# Patient Record
Sex: Female | Born: 1975
Health system: Southern US, Community
[De-identification: ages and names within clinical notes are randomized; demographics above are authoritative.]

## PROBLEM LIST (undated history)

## (undated) HISTORY — PX: DILATION AND CURETTAGE OF UTERUS: SHX78

---

## 1999-09-19 ENCOUNTER — Other Ambulatory Visit: Admission: RE | Admit: 1999-09-19 | Discharge: 1999-09-19 | Payer: Self-pay | Admitting: Obstetrics & Gynecology

## 1999-12-02 ENCOUNTER — Other Ambulatory Visit: Admission: RE | Admit: 1999-12-02 | Discharge: 1999-12-02 | Payer: Self-pay | Admitting: Obstetrics & Gynecology

## 1999-12-17 ENCOUNTER — Ambulatory Visit (HOSPITAL_COMMUNITY): Admission: AD | Admit: 1999-12-17 | Discharge: 1999-12-17 | Payer: Self-pay | Admitting: Obstetrics and Gynecology

## 1999-12-17 ENCOUNTER — Encounter (INDEPENDENT_AMBULATORY_CARE_PROVIDER_SITE_OTHER): Payer: Self-pay | Admitting: Specialist

## 2000-08-21 ENCOUNTER — Ambulatory Visit (HOSPITAL_COMMUNITY): Admission: RE | Admit: 2000-08-21 | Discharge: 2000-08-21 | Payer: Self-pay | Admitting: *Deleted

## 2000-08-21 ENCOUNTER — Encounter (INDEPENDENT_AMBULATORY_CARE_PROVIDER_SITE_OTHER): Payer: Self-pay | Admitting: Specialist

## 2000-12-27 ENCOUNTER — Inpatient Hospital Stay (HOSPITAL_COMMUNITY): Admission: AD | Admit: 2000-12-27 | Discharge: 2000-12-27 | Payer: Self-pay | Admitting: Obstetrics and Gynecology

## 2001-02-02 ENCOUNTER — Ambulatory Visit (HOSPITAL_COMMUNITY): Admission: RE | Admit: 2001-02-02 | Discharge: 2001-02-02 | Payer: Self-pay | Admitting: Obstetrics and Gynecology

## 2001-02-02 ENCOUNTER — Encounter: Payer: Self-pay | Admitting: Obstetrics and Gynecology

## 2001-07-05 ENCOUNTER — Inpatient Hospital Stay (HOSPITAL_COMMUNITY): Admission: AD | Admit: 2001-07-05 | Discharge: 2001-07-07 | Payer: Self-pay | Admitting: Obstetrics and Gynecology

## 2001-08-10 ENCOUNTER — Other Ambulatory Visit: Admission: RE | Admit: 2001-08-10 | Discharge: 2001-08-10 | Payer: Self-pay | Admitting: Obstetrics and Gynecology

## 2004-03-21 ENCOUNTER — Other Ambulatory Visit: Admission: RE | Admit: 2004-03-21 | Discharge: 2004-03-21 | Payer: Self-pay | Admitting: Obstetrics and Gynecology

## 2005-05-02 ENCOUNTER — Inpatient Hospital Stay (HOSPITAL_COMMUNITY): Admission: AD | Admit: 2005-05-02 | Discharge: 2005-05-04 | Payer: Self-pay | Admitting: Obstetrics and Gynecology

## 2005-06-17 ENCOUNTER — Other Ambulatory Visit: Admission: RE | Admit: 2005-06-17 | Discharge: 2005-06-17 | Payer: Self-pay | Admitting: Obstetrics and Gynecology

## 2007-10-21 HISTORY — PX: BREAST ENHANCEMENT SURGERY: SHX7

## 2007-12-03 ENCOUNTER — Emergency Department (HOSPITAL_COMMUNITY): Admission: EM | Admit: 2007-12-03 | Discharge: 2007-12-03 | Payer: Self-pay | Admitting: Emergency Medicine

## 2008-12-28 ENCOUNTER — Emergency Department (HOSPITAL_COMMUNITY): Admission: EM | Admit: 2008-12-28 | Discharge: 2008-12-28 | Payer: Self-pay | Admitting: Family Medicine

## 2011-01-30 LAB — POCT URINALYSIS DIP (DEVICE)
Bilirubin Urine: NEGATIVE
Glucose, UA: NEGATIVE mg/dL
Ketones, ur: NEGATIVE mg/dL
Nitrite: NEGATIVE
Protein, ur: NEGATIVE mg/dL
Specific Gravity, Urine: 1.01 (ref 1.005–1.030)
Urobilinogen, UA: 0.2 mg/dL (ref 0.0–1.0)
pH: 7 (ref 5.0–8.0)

## 2011-01-30 LAB — URINE CULTURE: Colony Count: 10000

## 2011-01-30 LAB — POCT PREGNANCY, URINE: Preg Test, Ur: NEGATIVE

## 2011-03-07 NOTE — Op Note (Signed)
[facility]  PatientDAZIYA, [name]                        MRN: [id number] Proc. Date: 08/21/00 Adm. Date:  [id number] Attending:  Pleas Koch                           Operative Report  PREOPERATIVE DIAGNOSIS:       Missed abortion at 8 weeks.  POSTOPERATIVE DIAGNOSIS:      Missed abortion at 8 weeks.  OPERATION PERFORMED:          Suction ______ and evacuation.  SURGEON:                      Georgina Peer, M.D.  ANESTHESIA:                   Monitored anesthesia care using 1% Xylocaine paracervical block.  ESTIMATED BLOOD LOSS:         Less than 50 cc.  COMPLICATIONS:                None.  INDICATIONS:                  A 36 year old gravida 3, para 0-0-2-0 with eight weeks from her last menstrual period and ultrasound revealed a nonviable 8 week fetus.  She elected D&E.  DESCRIPTION OF PROCEDURE:     Patient was taken to the operating room, given IV sedation, and placed in the dorsal lithotomy position.  Prepped and draped in the normal sterile fashion.  Red rubber catheter emptied her bladder. Bimanual examination revealed a 6 week anterior uterus with no adnexal masses. The patient had paracervical block using 20 cc 1% Xylocaine plain injected and then cervix was progressively dilated to a 25 [place] with Pratt dilators and a 10 curved suction curette was thoroughly evacuate the uterine contents.  There was minimal bleeding.  The uterus was well contracted and normal size at the end of the case.  Sponge, needle, and instrument counts were correct.  Patient tolerated the procedure well and returned to the recovery area in stable condition. DD:  08/21/00 TD:  08/21/00 Job: [id number]

## 2011-03-07 NOTE — Discharge Summary (Signed)
[name], [name]               ACCOUNT NO.:  [id number]   MEDICAL RECORD NO.:  [id number]          PATIENT TYPE:  INP   LOCATION:  9116                          FACILITY:  [facility]   PHYSICIAN:  Huel Cote, M.D. DATE OF BIRTH:  11/23/1975   DATE OF ADMISSION:  05/02/2005  DATE OF DISCHARGE:                                 DISCHARGE SUMMARY   DISCHARGE DIAGNOSES:  1.  Term pregnancy at 39+ weeks, delivered.  2.  Status post normal spontaneous vaginal delivery.   DISCHARGE MEDICATIONS:  Motrin 600 mg p.o. q.6h.   DISCHARGE FOLLOW-UP:  The patient is to follow up in the office in 6 weeks  for her routine postpartum exam.   HOSPITAL COURSE:  The patient is a 35 year old G5 P1-0-3-1 who is admitted  at 39+ weeks gestation for induction of labor given a term status, favorable  cervix, and a history of macrosomia. Prenatal care was uneventful. Prenatal  labs are as follows:  O positive, antibody negative, rubella immune, RPR  nonreactive, hepatitis B negative, HIV negative, GC negative, chlamydia  negative, 1-hour Glucola 114, group B strep negative. Past obstetric  history:  In 2002 she had a vaginal delivery of a 9-pound 8-ounce infant and  she has had two spontaneous miscarriages and one elective abortion. Past GYN  history:  None. Past medical history:  None. Past surgical history:  She had  D&C x2. Allergies:  None. Medications:  None. On admission she was afebrile  with stable vital signs. Fetal heart rate was reactive. Cervix was 50 and 2  and a -2. She had rupture of membranes performed with clear fluid noted and  was placed on IV Pitocin. She did very well and reached complete dilatation  throughout the day, pushed well, and delivered a vigorous female infant over  an OA position. Apgars were 9 and 9, weight was 7 pounds 6 ounces. The  placenta delivered spontaneously. There were no lacerations on the cervix,  rectum, or vagina, and estimated blood loss was about 350 mL. The  patient  did very well postpartum and on postpartum day #2 was ready for discharge  home. Her discharge hemoglobin was 11 and she was afebrile with stable vital  signs and had no problems.       KR/MEDQ  D:  05/04/2005  T:  05/04/2005  Job:  [id number]

## 2011-03-07 NOTE — Discharge Summary (Signed)
[facility]  PatientKARRY, [name] Visit Number: [id number] MRN: [id number]          Service Type: OBS Location: [facility] Attending Physician:  Oliver Pila Dictated by:   Alvino Chapel, M.D.                             Discharge Summary  DISCHARGE DIAGNOSES:          1. Term pregnancy at 40+ weeks, delivered.                               2. Status post normal spontaneous vaginal                                  delivery.  DISCHARGE MEDICATIONS:        1. Motrin 600 mg p.o. every six hours p.r.n.                               2. Percocet one to two tablets p.o. every four                                  hours p.r.n.  DISCHARGE FOLLOWUP:           The patient is to follow up in approximately six weeks for her routine postpartum exam.  HOSPITAL COURSE:              The patient is a 35 year old, G4, P0-0-3-0, who was admitted at 40-3/7 weeks for induction given postdates and favorable cervix. Pregnancy was uncomplicated.  PRENATAL LABORATORY DATA:     O positive, antibody negative, rubella immune, hepatitis B surface antigen negative, HIV negative, GC negative, Chlamydia negative, AFP negative, GBS negative.  PAST OBSTETRIC HISTORY:       In 1994 she had an elective abortion. 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  PAST GYNECOLOGIC HISTORY:     D&C x 2. History of irregular periods.  PAST MEDICAL HISTORY:         None.  PAST SURGICAL HISTORY:        D&C x 2.  ALLERGIES:                    None.  MEDICATIONS:                  None.  HOSPITAL COURSE:              The patient was afebrile with stable vital signs. Fetal heart rate was reactive on admission. Cervix was 70% effaced, 2+ cm dilated, and a -2 station. EFW was 8 to 8-1/2 pounds. The patient had assisted ruptured membranes with clear fluid obtained. She then was placed on Pitocin and progressed well throughout the day. She reached complete  dilation and pushed for approximately 15 minutes with a normal spontaneous vaginal delivery of a viable female infant over an intact perineum. Apgars were 8 and 9. Weight was 9 pounds 8 ounces. There was a tight nuchal cord which was delivered through. A mild shoulder dystocia was relived with McRoberts and suprapubic pressure. There were bilaterally periurethral lacerations which were repaired with  3-0 Vicryl in interrupted sutures for hemostasis. Cervix, rectum, and vagina were all intact. The patient was then admitted for routine postpartum care and did well.  On postpartum day #2 she was afebrile with stable vital signs and was felt stable for discharge. She was going to stay in house with the baby for one additional day who is undergoing phototherapy for jaundice. Dictated by:   Alvino Chapel, M.D. Attending Physician:  Oliver Pila DD:  07/07/01 TD:  07/07/01 Job: [id number]

## 2011-03-07 NOTE — Op Note (Signed)
[facility]  PatientGENIVA, [name]                        MRN: [id number] Proc. Date: 12/17/99 Adm. Date:  [id number] Attending:  Cleatrice Burke                           Operative Report  PREOPERATIVE DIAGNOSIS:       Blighted ovum.  POSTOPERATIVE DIAGNOSIS:      Blighted ovum.  OPERATION:                    Dilatation and evacuation.  SURGEON:                      Cecilio Asper, M.D.  ASSISTANT:  ANESTHESIA:                   MAC and local.  ESTIMATED BLOOD LOSS:         Minimal.  COMPLICATIONS:                None.  INDICATIONS:                  The patient is a 35 year old, gravida 2, para 0, ho was diagnosed with a blighted ovum by ultrasound.  The patient therefore consented for definitive therapy in the form of dilatation and evacuation.  DESCRIPTION OF PROCEDURE:     After adequate level of MAC anesthesia was obtained, the patient was prepped and draped in a sterile fashion.  A speculum was placed  inside of the vagina.  10 cc of 1% lidocaine was injected into the cervix to achieve a paracervical block.  A single tooth tenaculum was placed on the anterior lip of the cervix.  The uterus sounded to approximately 10 cm.  The cervix was dilated to a #29 [place] using the [employer].  A #8 straight suction curet as placed inside of the uterus and a suction curettage was performed with a moderate amount of tissue being obtained.  Using a medium sharp curet, a sharp curettage was performed until a gritty feeling was noted on all surfaces.  The uterus firmed p nicely with Pitocin.  Bleeding was minimal.  Vaginal instruments were removed. The patient tolerated the procedure well.  She was taken to the recovery room in stable condition. DD:  12/17/99 TD:  12/17/99 Job: [id number]

## 2011-10-24 ENCOUNTER — Encounter (HOSPITAL_COMMUNITY): Payer: Self-pay | Admitting: Anesthesiology

## 2011-10-24 ENCOUNTER — Emergency Department (HOSPITAL_COMMUNITY): Payer: No Typology Code available for payment source

## 2011-10-24 ENCOUNTER — Encounter (HOSPITAL_COMMUNITY)
Admission: EM | Disposition: A | Discharge status: Home / Self Care | Payer: Self-pay | Source: Home / Self Care | Attending: Emergency Medicine

## 2011-10-24 ENCOUNTER — Emergency Department (HOSPITAL_COMMUNITY): Payer: No Typology Code available for payment source | Admitting: Anesthesiology

## 2011-10-24 ENCOUNTER — Emergency Department (HOSPITAL_COMMUNITY)
Admission: EM | Admit: 2011-10-24 | Discharge: 2011-10-25 | Disposition: A | Discharge status: Home / Self Care | Payer: No Typology Code available for payment source | Attending: Emergency Medicine | Admitting: Emergency Medicine

## 2011-10-24 DIAGNOSIS — S62109A Fracture of unspecified carpal bone, unspecified wrist, initial encounter for closed fracture: Secondary | ICD-10-CM

## 2011-10-24 DIAGNOSIS — S0003XA Contusion of scalp, initial encounter: Secondary | ICD-10-CM | POA: Insufficient documentation

## 2011-10-24 DIAGNOSIS — S02609A Fracture of mandible, unspecified, initial encounter for closed fracture: Secondary | ICD-10-CM

## 2011-10-24 DIAGNOSIS — S02620A Fracture of subcondylar process of mandible, unspecified side, initial encounter for closed fracture: Secondary | ICD-10-CM | POA: Insufficient documentation

## 2011-10-24 DIAGNOSIS — S81009A Unspecified open wound, unspecified knee, initial encounter: Secondary | ICD-10-CM | POA: Insufficient documentation

## 2011-10-24 DIAGNOSIS — S1093XA Contusion of unspecified part of neck, initial encounter: Secondary | ICD-10-CM | POA: Insufficient documentation

## 2011-10-24 DIAGNOSIS — S0180XA Unspecified open wound of other part of head, initial encounter: Secondary | ICD-10-CM | POA: Insufficient documentation

## 2011-10-24 DIAGNOSIS — S0181XA Laceration without foreign body of other part of head, initial encounter: Secondary | ICD-10-CM

## 2011-10-24 DIAGNOSIS — S52599A Other fractures of lower end of unspecified radius, initial encounter for closed fracture: Secondary | ICD-10-CM | POA: Insufficient documentation

## 2011-10-24 HISTORY — PX: CLOSED REDUCTION MANDIBLE WITH MANDIBULOMA: SHX5313

## 2011-10-24 SURGERY — CLOSED REDUCTION, MANDIBLE, WITH ARCH BAR APPLICATION AND INTERMAXILLARY FIXATION
Anesthesia: General | Site: Mouth | Laterality: Right | Wound class: Clean Contaminated

## 2011-10-24 MED ORDER — MORPHINE SULFATE 4 MG/ML IJ SOLN
4.0000 mg | Freq: Once | INTRAMUSCULAR | Status: AC
  Administered 2011-10-24: 4 mg via INTRAVENOUS
  Filled 2011-10-24: qty 1

## 2011-10-24 MED ORDER — CEFAZOLIN SODIUM 1-5 GM-% IV SOLN
INTRAVENOUS | Status: AC
  Filled 2011-10-24: qty 50

## 2011-10-24 MED ORDER — HYDROMORPHONE HCL PF 1 MG/ML IJ SOLN
1.0000 mg | Freq: Once | INTRAMUSCULAR | Status: AC
  Administered 2011-10-24: 1 mg via INTRAVENOUS
  Filled 2011-10-24: qty 1

## 2011-10-24 MED ORDER — MORPHINE SULFATE 2 MG/ML IJ SOLN
2.0000 mg | Freq: Once | INTRAMUSCULAR | Status: AC
  Administered 2011-10-24: 2 mg via INTRAVENOUS
  Filled 2011-10-24: qty 1

## 2011-10-24 MED ORDER — SODIUM CHLORIDE 0.9 % IV BOLUS (SEPSIS)
1000.0000 mL | Freq: Once | INTRAVENOUS | Status: AC
  Administered 2011-10-24: 1000 mL via INTRAVENOUS

## 2011-10-24 SURGICAL SUPPLY — 38 items
BLADE SURG 15 STRL LF DISP TIS (BLADE) ×2 IMPLANT
BLADE SURG 15 STRL SS (BLADE) ×3
BLADE SURG ROTATE 9660 (MISCELLANEOUS) IMPLANT
CANISTER SUCTION 2500CC (MISCELLANEOUS) ×3 IMPLANT
CLEANER TIP ELECTROSURG 2X2 (MISCELLANEOUS) ×3 IMPLANT
CLOTH BEACON ORANGE TIMEOUT ST (SAFETY) ×3 IMPLANT
COVER SURGICAL LIGHT HANDLE (MISCELLANEOUS) ×3 IMPLANT
DECANTER SPIKE VIAL GLASS SM (MISCELLANEOUS) ×3 IMPLANT
DRSG NASOPORE 8CM (GAUZE/BANDAGES/DRESSINGS) IMPLANT
ELECT COATED BLADE 2.86 ST (ELECTRODE) ×3 IMPLANT
ELECT REM PT RETURN 9FT ADLT (ELECTROSURGICAL) ×3
ELECTRODE REM PT RTRN 9FT ADLT (ELECTROSURGICAL) ×2 IMPLANT
GLOVE SURG SS PI 7.5 STRL IVOR (GLOVE) ×3 IMPLANT
GOWN STRL NON-REIN LRG LVL3 (GOWN DISPOSABLE) ×6 IMPLANT
KIT BASIN OR (CUSTOM PROCEDURE TRAY) ×3 IMPLANT
KIT ROOM TURNOVER OR (KITS) ×3 IMPLANT
NEEDLE 27GAX1X1/2 (NEEDLE) ×3 IMPLANT
NS IRRIG 1000ML POUR BTL (IV SOLUTION) ×3 IMPLANT
PAD ARMBOARD 7.5X6 YLW CONV (MISCELLANEOUS) ×3 IMPLANT
PENCIL FOOT CONTROL (ELECTRODE) ×3 IMPLANT
SCISSORS WIRE ANG 4 3/4 DISP (INSTRUMENTS) ×3 IMPLANT
SCREW UPPER FACE 2.0X12MM (Screw) ×3 IMPLANT
SCREW UPPER FACE 2.0X8MM (Screw) ×6 IMPLANT
SUT ETHILON 4 0 CL P 3 (SUTURE) ×3 IMPLANT
SUT MON AB 3-0 SH 27 (SUTURE)
SUT MON AB 3-0 SH27 (SUTURE) IMPLANT
SUT PROLENE 6 0 PC 1 (SUTURE) IMPLANT
SUT STEEL 0 (SUTURE)
SUT STEEL 0 18XMFL TIE 17 (SUTURE) IMPLANT
SUT STEEL 1 (SUTURE) IMPLANT
SUT STEEL 2 (SUTURE) ×3 IMPLANT
SUT STEEL 4 (SUTURE) IMPLANT
SUT VICRYL 4-0 PS2 18IN ABS (SUTURE) ×3 IMPLANT
TOWEL OR 17X24 6PK STRL BLUE (TOWEL DISPOSABLE) ×3 IMPLANT
TOWEL OR 17X26 10 PK STRL BLUE (TOWEL DISPOSABLE) ×3 IMPLANT
TRAY ENT MC OR (CUSTOM PROCEDURE TRAY) ×3 IMPLANT
TRAY FOLEY CATH 14FRSI W/METER (CATHETERS) IMPLANT
WATER STERILE IRR 1000ML POUR (IV SOLUTION) IMPLANT

## 2011-10-24 NOTE — ED Notes (Signed)
Pt transported to OR stable and in no acute distress.

## 2011-10-24 NOTE — ED Notes (Signed)
Patient transported to CT 

## 2011-10-24 NOTE — ED Notes (Signed)
Family at bedside. 

## 2011-10-24 NOTE — ED Notes (Signed)
Pt to go to OR for mandible fixation.  MD aware pt still needs laceration sutured and arm splinted.  Ortho paged.

## 2011-10-24 NOTE — ED Notes (Signed)
Pt feeling much better after Dilaudid.  Noted resp equal and unlabored and rates pain 4/10. Family at bedside.

## 2011-10-24 NOTE — Anesthesia Preprocedure Evaluation (Addendum)
Anesthesia Evaluation  Patient identified by MRN, date of birth, ID band Patient awake    Reviewed: Allergy & Precautions, H&P , NPO status , Patient's Chart, lab work & pertinent test results  Airway Mallampati: I TM Distance: >3 FB Neck ROM: full  Mouth opening: Limited Mouth Opening  Dental  (+) Dental Advisory Given   Pulmonary neg pulmonary ROS,    Pulmonary exam normal       Cardiovascular neg cardio ROS regular Normal    Neuro/Psych Negative Neurological ROS  Negative Psych ROS   GI/Hepatic Neg liver ROS,   Endo/Other  Negative Endocrine ROS  Renal/GU negative Renal ROS  Genitourinary negative   Musculoskeletal   Abdominal (+)  Abdomen: soft. Bowel sounds: normal.  Peds  Hematology   Anesthesia Other Findings malocclusion secondary to mandibular fx    Reproductive/Obstetrics                          Anesthesia Physical Anesthesia Plan  ASA: I  Anesthesia Plan: General   Post-op Pain Management:    Induction: Intravenous, Rapid sequence and Cricoid pressure planned  Airway Management Planned: Nasal ETT  Additional Equipment:   Intra-op Plan:   Post-operative Plan: Extubation in OR  Informed Consent: I have reviewed the patients History and Physical, chart, labs and discussed the procedure including the risks, benefits and alternatives for the proposed anesthesia with the patient or authorized representative who has indicated his/her understanding and acceptance.     Plan Discussed with: Anesthesiologist, Surgeon and CRNA  Anesthesia Plan Comments:        Anesthesia Quick Evaluation
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
## 2011-10-24 NOTE — Progress Notes (Signed)
Orthopedic Tech Progress Note Patient Details:  [name] 05/05/1976 [id number]  Type of Splint: Sugartong Splint Location: (R) UE Splint Interventions: Application    Jennye Moccasin 10/24/2011, 11:23 PM
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
## 2011-10-24 NOTE — ED Notes (Signed)
Belongings removed and given to s/o in prep for OR.

## 2011-10-25 MED ORDER — ONDANSETRON HCL 4 MG/2ML IJ SOLN
INTRAMUSCULAR | Status: DC | PRN
  Administered 2011-10-25: 4 mg via INTRAVENOUS

## 2011-10-25 MED ORDER — PROPOFOL 10 MG/ML IV EMUL
INTRAVENOUS | Status: DC | PRN
  Administered 2011-10-24: 170 mg via INTRAVENOUS

## 2011-10-25 MED ORDER — MIDAZOLAM HCL 5 MG/5ML IJ SOLN
INTRAMUSCULAR | Status: DC | PRN
  Administered 2011-10-24: 1 mg via INTRAVENOUS

## 2011-10-25 MED ORDER — DEXAMETHASONE SODIUM PHOSPHATE 4 MG/ML IJ SOLN
INTRAMUSCULAR | Status: DC | PRN
  Administered 2011-10-25: 8 mg via INTRAVENOUS

## 2011-10-25 MED ORDER — LIDOCAINE-EPINEPHRINE 1 %-1:100000 IJ SOLN
INTRAMUSCULAR | Status: DC | PRN
  Administered 2011-10-25: 10 mL

## 2011-10-25 MED ORDER — LACTATED RINGERS IV SOLN: INTRAVENOUS | Status: DC

## 2011-10-25 MED ORDER — ONDANSETRON HCL 4 MG/2ML IJ SOLN: 4.0000 mg | Freq: Once | INTRAMUSCULAR | Status: DC | PRN

## 2011-10-25 MED ORDER — FENTANYL CITRATE 0.05 MG/ML IJ SOLN
INTRAMUSCULAR | Status: DC | PRN
  Administered 2011-10-24: 50 ug via INTRAVENOUS
  Administered 2011-10-24 – 2011-10-25 (×2): 100 ug via INTRAVENOUS

## 2011-10-25 MED ORDER — CEFAZOLIN SODIUM 1-5 GM-% IV SOLN
INTRAVENOUS | Status: DC | PRN
  Administered 2011-10-24: 1 g via INTRAVENOUS

## 2011-10-25 MED ORDER — 0.9 % SODIUM CHLORIDE (POUR BTL) OPTIME
TOPICAL | Status: DC | PRN
  Administered 2011-10-25: 1000 mL

## 2011-10-25 MED ORDER — HYDROMORPHONE HCL PF 1 MG/ML IJ SOLN
0.2500 mg | INTRAMUSCULAR | Status: DC | PRN
  Administered 2011-10-25 (×2): 0.5 mg via INTRAVENOUS

## 2011-10-25 MED ORDER — LACTATED RINGERS IV SOLN
INTRAVENOUS | Status: DC | PRN
  Administered 2011-10-24: via INTRAVENOUS

## 2011-10-25 MED ORDER — SUCCINYLCHOLINE CHLORIDE 20 MG/ML IJ SOLN
INTRAMUSCULAR | Status: DC | PRN
  Administered 2011-10-24: 100 mg via INTRAVENOUS

## 2011-10-25 NOTE — Transfer of Care (Signed)
Immediate Anesthesia Transfer of Care Note  Patient: [name]  Procedure(s) Performed:  CLOSED REDUCTION MANDIBLE WITH MANDIBULOMAXILLARY FUSION  Patient Location: PACU  Anesthesia Type: General  Level of Consciousness: awake, sedated and patient cooperative  Airway & Oxygen Therapy: Patient Spontanous Breathing and Patient connected to nasal cannula oxygen  Post-op Assessment: Report given to PACU RN, Post -op Vital signs reviewed and stable and Patient moving all extremities  Post vital signs: Reviewed and stable  Complications: No apparent anesthesia complications

## 2011-10-25 NOTE — Brief Op Note (Signed)
10/24/2011 - 10/25/2011  1:03 AM  PATIENT:  [name]  36 y.o. female  PRE-OPERATIVE DIAGNOSIS: right subcondylar Mandible Fracture  POST-OPERATIVE DIAGNOSIS: right subcondylar Mandible Fracture  PROCEDURE:  Procedure(s): CLOSED REDUCTION MANDIBLE WITH MAXILLOMANDIBULAR FIXATION 21453  SURGEON:  Surgeon(s): [employer]  PHYSICIAN ASSISTANT: none  ASSISTANTS: none  ANESTHESIA:   general  EBL:  Total I/O In: 675 [I.V.:675] Out: -   BLOOD ADMINISTERED:none  DRAINS: none   LOCAL MEDICATIONS USED:  XYLOCAINE 10CC  SPECIMEN:  No Specimen  DISPOSITION OF SPECIMEN:  N/A  COUNTS:  YES  TOURNIQUET:  * No tourniquets in log *  DICTATION: .Note written in EPIC  PLAN OF CARE: Discharge to home after PACU  PATIENT DISPOSITION:  PACU - hemodynamically stable.   Delay start of Pharmacological VTE agent (>24hrs) due to surgical blood loss or risk of bleeding: no

## 2011-10-25 NOTE — Op Note (Signed)
10/25/2011  1:06 AM    [name]  [id number]   PRE-OPERATIVE DIAGNOSIS: right subcondylar Mandible Fracture  POST-OPERATIVE DIAGNOSIS: right subcondylar Mandible Fracture  PROCEDURE:  Procedure(s): CLOSED REDUCTION RIGHT SUBCONDYLAR MANDIBLE FRACTURE WITH MAXILLOMANDIBULAR FIXATION 21453 13131 complex repair right chin laceration 1cm 13132 complex repair midline shin laceration 6cm   Surg: Melvenia Beam  Anes:  GNT  EBL:  minimal  Comp: none  Findings: class I postoperative occlusion, well-approximated chin lacerations  Procedure: With the patient in a comfortable supine position, general nasotracheal anesthesia was induced without difficulty.   At an appropriate level, the patient was placed in a semisitting position.  The pharynx was suctioned clear. Betadine scrub then prep was used to prep the face and chin in sterile fashion.  1% Xylocaine with 1:100,000 epinephrine,  10 cc's total, was infiltrated into the chin lacerations and into the anterior maxillary mucosa and on the anterior inferior mandible on each side in anticipation of bicortical screw placement.  Several minutes were allowed for this to take effect.  The jaws were manipulated to reestablish occlusion.     8 mm bicortical screws were placed superomedial to the canine roots maxillary, and 12 mm screws were placed inferomedial to the canine roots mandibular.  Hemostasis was observed.  24-gauge stainless steel wire loops were prepared. The jaws were manipulated back into occlusion. Two vertical wire loops were applied and gently tightened down, placing the patient in her native class I preoperative occlusion.  Good occlusion was noted.  Tightening was completed.  The wires were cut and then twisted with the ends buried to avoid trauma to the oral vestibule.  Hemostasis was observed.  Next the 1 cm left chin and 6 cm midline deep chin lacerations were irrigated out with 500 mL of sterile saline. The left 1 cm  laceration was closed with 2 deep buried 4-0 vicryl sutures and dermabond for the skin layer. The midline 6cm laceration was closed with 6 deep buried 4-0 vicryl sutures and the skin was closed with Dermabond, with good wound approximation noted. The betadine prep was cleaned from the patient's skin.  The patient was returned to anesthesia, fully awakened and extubated.  She was transferred to recovery in stable condition. The patient tolerated the procedure well with no complications.  Dr. Melvenia Beam was present and performed the entire procedure.    Dispo:   PACU to 23 hour observation.  Plan: Ice, elevation,  Narcotic analgesia, antiemetics.  We will send her home with wire cutters.  Wired jaw diet.  Recheck my office one week.   Melvenia Beam 1:19 AM 10/25/2011

## 2011-10-25 NOTE — Anesthesia Postprocedure Evaluation (Signed)
  Anesthesia Post-op Note  Patient: [name]  Procedure(s) Performed:  CLOSED REDUCTION MANDIBLE WITH MANDIBULOMAXILLARY FUSION  Patient Location: PACU  Anesthesia Type: General  Level of Consciousness: awake, oriented, sedated and patient cooperative  Airway and Oxygen Therapy: Patient Spontanous Breathing and Patient connected to nasal cannula oxygen  Post-op Pain: mild  Post-op Assessment: Post-op Vital signs reviewed, Patient's Cardiovascular Status Stable, Respiratory Function Stable, Patent Airway, No signs of Nausea or vomiting and Pain level controlled  Post-op Vital Signs: stable  Complications: No apparent anesthesia complications
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
## 2011-10-27 ENCOUNTER — Encounter (HOSPITAL_COMMUNITY): Payer: Self-pay | Admitting: Otolaryngology

## 2011-10-27 ENCOUNTER — Other Ambulatory Visit: Payer: Self-pay | Admitting: Orthopedic Surgery

## 2011-10-27 ENCOUNTER — Encounter (HOSPITAL_COMMUNITY): Payer: Self-pay

## 2011-10-27 MED ORDER — CEFAZOLIN SODIUM 1-5 GM-% IV SOLN
1.0000 g | INTRAVENOUS | Status: AC
  Administered 2011-10-28: 1 g via INTRAVENOUS

## 2011-10-28 ENCOUNTER — Encounter (HOSPITAL_COMMUNITY): Payer: Self-pay | Admitting: Anesthesiology

## 2011-10-28 ENCOUNTER — Ambulatory Visit (HOSPITAL_COMMUNITY)
Admission: RE | Admit: 2011-10-28 | Discharge: 2011-10-29 | Disposition: A | Discharge status: Home / Self Care | Payer: No Typology Code available for payment source | Source: Ambulatory Visit | Attending: Orthopedic Surgery | Admitting: Orthopedic Surgery

## 2011-10-28 ENCOUNTER — Ambulatory Visit (HOSPITAL_COMMUNITY): Payer: No Typology Code available for payment source | Admitting: Anesthesiology

## 2011-10-28 ENCOUNTER — Encounter (HOSPITAL_COMMUNITY)
Admission: RE | Disposition: A | Discharge status: Home / Self Care | Payer: Self-pay | Source: Ambulatory Visit | Attending: Orthopedic Surgery

## 2011-10-28 ENCOUNTER — Encounter (HOSPITAL_COMMUNITY): Payer: Self-pay

## 2011-10-28 DIAGNOSIS — S52599A Other fractures of lower end of unspecified radius, initial encounter for closed fracture: Secondary | ICD-10-CM | POA: Insufficient documentation

## 2011-10-28 DIAGNOSIS — S52509A Unspecified fracture of the lower end of unspecified radius, initial encounter for closed fracture: Secondary | ICD-10-CM

## 2011-10-28 DIAGNOSIS — Y929 Unspecified place or not applicable: Secondary | ICD-10-CM | POA: Insufficient documentation

## 2011-10-28 HISTORY — PX: ORIF ULNAR FRACTURE: SHX5417

## 2011-10-28 LAB — CBC
HCT: 41.8 % (ref 36.0–46.0)
MCH: 30.3 pg (ref 26.0–34.0)
MCV: 89.9 fL (ref 78.0–100.0)
Platelets: 175 10*3/uL (ref 150–400)
RBC: 4.65 MIL/uL (ref 3.87–5.11)
WBC: 6.3 10*3/uL (ref 4.0–10.5)

## 2011-10-28 LAB — BASIC METABOLIC PANEL
BUN: 10 mg/dL (ref 6–23)
CO2: 30 mEq/L (ref 19–32)
Calcium: 9.6 mg/dL (ref 8.4–10.5)
GFR calc non Af Amer: 88 mL/min — ABNORMAL LOW (ref >90)
Glucose, Bld: 88 mg/dL (ref 70–99)
Potassium: 4.1 mEq/L (ref 3.5–5.1)

## 2011-10-28 SURGERY — OPEN REDUCTION INTERNAL FIXATION (ORIF) ULNAR FRACTURE
Anesthesia: Monitor Anesthesia Care | Site: Arm Lower | Laterality: Right | Wound class: Clean

## 2011-10-28 MED ORDER — ALPRAZOLAM 0.5 MG PO TABS: 0.5000 mg | ORAL_TABLET | Freq: Four times a day (QID) | ORAL | Status: DC | PRN

## 2011-10-28 MED ORDER — METHOCARBAMOL 100 MG/ML IJ SOLN
500.0000 mg | Freq: Four times a day (QID) | INTRAVENOUS | Status: DC | PRN
  Filled 2011-10-28: qty 5

## 2011-10-28 MED ORDER — MEPERIDINE HCL 25 MG/ML IJ SOLN: 6.2500 mg | INTRAMUSCULAR | Status: DC | PRN

## 2011-10-28 MED ORDER — ROPIVACAINE HCL 5 MG/ML IJ SOLN
INTRAMUSCULAR | Status: DC | PRN
  Administered 2011-10-28: 35 mL via EPIDURAL

## 2011-10-28 MED ORDER — 0.9 % SODIUM CHLORIDE (POUR BTL) OPTIME
TOPICAL | Status: DC | PRN
  Administered 2011-10-28: 1000 mL

## 2011-10-28 MED ORDER — MIDAZOLAM HCL 5 MG/5ML IJ SOLN
INTRAMUSCULAR | Status: DC | PRN
  Administered 2011-10-28: 1 mg via INTRAVENOUS
  Administered 2011-10-28: 2 mg via INTRAVENOUS
  Administered 2011-10-28: 1 mg via INTRAVENOUS

## 2011-10-28 MED ORDER — SODIUM CHLORIDE 0.45 % IV SOLN
INTRAVENOUS | Status: DC
  Administered 2011-10-28: 16:00:00 via INTRAVENOUS

## 2011-10-28 MED ORDER — LACTATED RINGERS IV SOLN: INTRAVENOUS | Status: DC

## 2011-10-28 MED ORDER — PROMETHAZINE HCL 25 MG/ML IJ SOLN: 6.2500 mg | INTRAMUSCULAR | Status: DC | PRN

## 2011-10-28 MED ORDER — ONDANSETRON HCL 4 MG/2ML IJ SOLN
INTRAMUSCULAR | Status: DC | PRN
  Administered 2011-10-28: 4 mg via INTRAVENOUS

## 2011-10-28 MED ORDER — POLYETHYLENE GLYCOL 3350 17 G PO PACK
17.0000 g | PACK | Freq: Every day | ORAL | Status: DC | PRN
  Filled 2011-10-28: qty 1

## 2011-10-28 MED ORDER — CEFAZOLIN SODIUM 1-5 GM-% IV SOLN
INTRAVENOUS | Status: AC
  Filled 2011-10-28: qty 50

## 2011-10-28 MED ORDER — CEFAZOLIN SODIUM 1-5 GM-% IV SOLN
1.0000 g | Freq: Three times a day (TID) | INTRAVENOUS | Status: DC
  Administered 2011-10-28 – 2011-10-29 (×2): 1 g via INTRAVENOUS
  Filled 2011-10-28 (×5): qty 50

## 2011-10-28 MED ORDER — ONDANSETRON HCL 4 MG PO TABS: 4.0000 mg | ORAL_TABLET | Freq: Four times a day (QID) | ORAL | Status: DC | PRN

## 2011-10-28 MED ORDER — DEXAMETHASONE SODIUM PHOSPHATE 4 MG/ML IJ SOLN
INTRAMUSCULAR | Status: DC | PRN
  Administered 2011-10-28: 4 mg via INTRAVENOUS

## 2011-10-28 MED ORDER — POVIDONE-IODINE 7.5 % EX SOLN
Freq: Once | CUTANEOUS | Status: DC
  Filled 2011-10-28: qty 118

## 2011-10-28 MED ORDER — MUPIROCIN 2 % EX OINT
TOPICAL_OINTMENT | CUTANEOUS | Status: AC
  Filled 2011-10-28: qty 22

## 2011-10-28 MED ORDER — HYDROCODONE-ACETAMINOPHEN 7.5-500 MG/15ML PO SOLN
10.0000 mL | ORAL | Status: DC | PRN
  Administered 2011-10-29 (×2): 10 mL via ORAL
  Filled 2011-10-28 (×2): qty 15

## 2011-10-28 MED ORDER — HYDROMORPHONE HCL PF 1 MG/ML IJ SOLN
0.5000 mg | INTRAMUSCULAR | Status: DC | PRN
  Administered 2011-10-29: 1 mg via INTRAVENOUS
  Filled 2011-10-28: qty 1

## 2011-10-28 MED ORDER — PROPOFOL 10 MG/ML IV EMUL
INTRAVENOUS | Status: DC | PRN
  Administered 2011-10-28: 25 ug/kg/min via INTRAVENOUS

## 2011-10-28 MED ORDER — DIPHENHYDRAMINE HCL 12.5 MG/5ML PO ELIX
25.0000 mg | ORAL_SOLUTION | Freq: Four times a day (QID) | ORAL | Status: DC | PRN
  Filled 2011-10-28: qty 10

## 2011-10-28 MED ORDER — METHOCARBAMOL 500 MG PO TABS: 500.0000 mg | ORAL_TABLET | Freq: Four times a day (QID) | ORAL | Status: DC | PRN

## 2011-10-28 MED ORDER — HYDROMORPHONE HCL PF 1 MG/ML IJ SOLN: 0.2500 mg | INTRAMUSCULAR | Status: DC | PRN

## 2011-10-28 MED ORDER — ONDANSETRON HCL 4 MG/2ML IJ SOLN: 4.0000 mg | Freq: Four times a day (QID) | INTRAMUSCULAR | Status: DC | PRN

## 2011-10-28 MED ORDER — DIPHENHYDRAMINE HCL 25 MG PO CAPS: 25.0000 mg | ORAL_CAPSULE | Freq: Four times a day (QID) | ORAL | Status: DC | PRN

## 2011-10-28 MED ORDER — LACTATED RINGERS IV SOLN
INTRAVENOUS | Status: DC | PRN
  Administered 2011-10-28: 13:00:00 via INTRAVENOUS

## 2011-10-28 MED ORDER — FENTANYL CITRATE 0.05 MG/ML IJ SOLN
INTRAMUSCULAR | Status: DC | PRN
  Administered 2011-10-28 (×2): 50 ug via INTRAVENOUS

## 2011-10-28 MED ORDER — SODIUM CHLORIDE 0.45 % IV SOLN: INTRAVENOUS | Status: DC

## 2011-10-28 SURGICAL SUPPLY — 70 items
BANDAGE CONFORM 2X5YD N/S (GAUZE/BANDAGES/DRESSINGS) ×2 IMPLANT
BANDAGE ELASTIC 3 VELCRO ST LF (GAUZE/BANDAGES/DRESSINGS) IMPLANT
BANDAGE ELASTIC 4 VELCRO ST LF (GAUZE/BANDAGES/DRESSINGS) IMPLANT
BANDAGE GAUZE ELAST BULKY 4 IN (GAUZE/BANDAGES/DRESSINGS) IMPLANT
BIT DRILL 2 FAST STEP (BIT) ×2 IMPLANT
BIT DRILL 2.5X4 QC (BIT) ×2 IMPLANT
BLADE SURG ROTATE 9660 (MISCELLANEOUS) IMPLANT
BNDG CMPR 9X4 STRL LF SNTH (GAUZE/BANDAGES/DRESSINGS) ×1
BNDG ESMARK 4X9 LF (GAUZE/BANDAGES/DRESSINGS) ×2 IMPLANT
CLOTH BEACON ORANGE TIMEOUT ST (SAFETY) ×2 IMPLANT
CORDS BIPOLAR (ELECTRODE) ×2 IMPLANT
COVER SURGICAL LIGHT HANDLE (MISCELLANEOUS) ×2 IMPLANT
CUFF TOURNIQUET SINGLE 18IN (TOURNIQUET CUFF) ×2 IMPLANT
CUFF TOURNIQUET SINGLE 24IN (TOURNIQUET CUFF) IMPLANT
DECANTER SPIKE VIAL GLASS SM (MISCELLANEOUS) IMPLANT
DRAIN TLS ROUND 10FR (DRAIN) IMPLANT
DRAPE INCISE IOBAN 66X45 STRL (DRAPES) IMPLANT
DRAPE OEC MINIVIEW 54X84 (DRAPES) ×2 IMPLANT
DRAPE U-SHAPE 47X51 STRL (DRAPES) ×2 IMPLANT
DRSG EMULSION OIL 3X3 NADH (GAUZE/BANDAGES/DRESSINGS) ×2 IMPLANT
ELECT REM PT RETURN 9FT ADLT (ELECTROSURGICAL) ×2
ELECTRODE REM PT RTRN 9FT ADLT (ELECTROSURGICAL) ×1 IMPLANT
GAUZE SPONGE 4X4 12PLY STRL LF (GAUZE/BANDAGES/DRESSINGS) ×2 IMPLANT
GAUZE XEROFORM 1X8 LF (GAUZE/BANDAGES/DRESSINGS) IMPLANT
GLOVE BIO SURGEON STRL SZ7 (GLOVE) ×2 IMPLANT
GLOVE BIOGEL PI IND STRL 7.0 (GLOVE) ×1 IMPLANT
GLOVE BIOGEL PI INDICATOR 7.0 (GLOVE) ×1
GLOVE BIOGEL PI ORTHO PRO SZ7 (GLOVE) ×1
GLOVE ORTHO TXT STRL SZ7.5 (GLOVE) ×2 IMPLANT
GLOVE PI ORTHO PRO STRL SZ7 (GLOVE) ×1 IMPLANT
GLOVE SS BIOGEL STRL SZ 8 (GLOVE) ×1 IMPLANT
GLOVE SUPERSENSE BIOGEL SZ 8 (GLOVE) ×1
GLOVE SURG SS PI 7.0 STRL IVOR (GLOVE) ×2 IMPLANT
GOWN PREVENTION PLUS XLARGE (GOWN DISPOSABLE) ×2 IMPLANT
GOWN STRL NON-REIN LRG LVL3 (GOWN DISPOSABLE) ×6 IMPLANT
KIT BASIN OR (CUSTOM PROCEDURE TRAY) ×2 IMPLANT
KIT ROOM TURNOVER OR (KITS) ×2 IMPLANT
LOOP VESSEL MAXI BLUE (MISCELLANEOUS) IMPLANT
MANIFOLD NEPTUNE II (INSTRUMENTS) ×2 IMPLANT
NEEDLE 22X1 1/2 (OR ONLY) (NEEDLE) IMPLANT
NEEDLE BLUNT 16X1.5 OR ONLY (NEEDLE) IMPLANT
NS IRRIG 1000ML POUR BTL (IV SOLUTION) ×2 IMPLANT
PACK ORTHO EXTREMITY (CUSTOM PROCEDURE TRAY) ×2 IMPLANT
PAD ARMBOARD 7.5X6 YLW CONV (MISCELLANEOUS) ×2 IMPLANT
PAD CAST 4YDX4 CTTN HI CHSV (CAST SUPPLIES) IMPLANT
PADDING CAST COTTON 4X4 STRL (CAST SUPPLIES)
PADDING WEBRIL 4 STERILE (GAUZE/BANDAGES/DRESSINGS) ×2 IMPLANT
PEG SUBCHONDRAL SMOOTH 2.0X16 (Peg) ×4 IMPLANT
PEG SUBCHONDRAL SMOOTH 2.0X18 (Peg) ×2 IMPLANT
PEG SUBCHONDRAL SMOOTH 2.0X20 (Peg) ×10 IMPLANT
PLATE STAN 24.4X59.5 RT (Plate) ×2 IMPLANT
SCREW BN 12X3.5XNS CORT TI (Screw) ×2 IMPLANT
SCREW CORT 3.5X10 LNG (Screw) ×4 IMPLANT
SCREW CORT 3.5X12 (Screw) ×4 IMPLANT
SPLINT FIBERGLASS 3X35 (CAST SUPPLIES) ×2 IMPLANT
SPONGE GAUZE 4X4 12PLY (GAUZE/BANDAGES/DRESSINGS) IMPLANT
SPONGE LAP 4X18 X RAY DECT (DISPOSABLE) ×2 IMPLANT
STAPLER VISISTAT 35W (STAPLE) IMPLANT
SUCTION FRAZIER TIP 10 FR DISP (SUCTIONS) ×2 IMPLANT
SUT ETHILON 4 0 PS 2 18 (SUTURE) IMPLANT
SUT PROLENE 4 0 PS 2 18 (SUTURE) ×6 IMPLANT
SUT VIC AB 3-0 FS2 27 (SUTURE) ×2 IMPLANT
SYR CONTROL 10ML LL (SYRINGE) IMPLANT
SYSTEM CHEST DRAIN TLS 7FR (DRAIN) IMPLANT
TOWEL OR 17X24 6PK STRL BLUE (TOWEL DISPOSABLE) ×2 IMPLANT
TOWEL OR 17X26 10 PK STRL BLUE (TOWEL DISPOSABLE) ×2 IMPLANT
TUBE CONNECTING 12X1/4 (SUCTIONS) ×2 IMPLANT
TUBE EVACUATION TLS (MISCELLANEOUS) IMPLANT
WATER STERILE IRR 1000ML POUR (IV SOLUTION) IMPLANT
YANKAUER SUCT BULB TIP NO VENT (SUCTIONS) IMPLANT

## 2011-10-28 NOTE — Anesthesia Postprocedure Evaluation (Signed)
  Anesthesia Post-op Note  Patient: [name]  Procedure(s) Performed:  OPEN REDUCTION INTERNAL FIXATION (ORIF) ULNAR FRACTURE - ORIF RIGHT DISTAL RADIAL FRACTURE WITH REPAIR AND RECONSTRUCTION  Patient Location: PACU  Anesthesia Type: MAC combined with regional for post-op pain  Level of Consciousness: awake and alert   Airway and Oxygen Therapy: Patient Spontanous Breathing and Patient connected to nasal cannula oxygen  Post-op Pain: none  Post-op Assessment: Post-op Vital signs reviewed, Patient's Cardiovascular Status Stable, Respiratory Function Stable, Patent Airway and No signs of Nausea or vomiting  Post-op Vital Signs: Reviewed and stable  Complications: No apparent anesthesia complications

## 2011-10-28 NOTE — Anesthesia Preprocedure Evaluation (Signed)
Anesthesia Evaluation  Patient identified by MRN, date of birth, ID band Patient awake    Reviewed: Allergy & Precautions, H&P , NPO status , Patient's Chart, lab work & pertinent test results  Airway     Mouth opening: Limited Mouth Opening  Dental No notable dental hx. (+) Teeth Intact   Pulmonary neg pulmonary ROS,  clear to auscultation  Pulmonary exam normal       Cardiovascular neg cardio ROS Regular Normal    Neuro/Psych Negative Neurological ROS  Negative Psych ROS   GI/Hepatic negative GI ROS, Neg liver ROS,   Endo/Other  Negative Endocrine ROS  Renal/GU negative Renal ROS  Genitourinary negative   Musculoskeletal   Abdominal   Peds  Hematology negative hematology ROS (+)   Anesthesia Other Findings   Reproductive/Obstetrics negative OB ROS                           Anesthesia Physical Anesthesia Plan  ASA: I  Anesthesia Plan: MAC   Post-op Pain Management:    Induction: Intravenous  Airway Management Planned: Mask  Additional Equipment:   Intra-op Plan:   Post-operative Plan:   Informed Consent: I have reviewed the patients History and Physical, chart, labs and discussed the procedure including the risks, benefits and alternatives for the proposed anesthesia with the patient or authorized representative who has indicated his/her understanding and acceptance.     Plan Discussed with: CRNA  Anesthesia Plan Comments:         Anesthesia Quick Evaluation

## 2011-10-28 NOTE — Progress Notes (Signed)
Mupirocin 2% adminstered to bil nares per protocol. Med did not appear in [facility] for scanning
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
## 2011-10-28 NOTE — Preoperative (Addendum)
Beta Blockers   Reason not to administer Beta Blockers:Not Applicable 
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
## 2011-10-28 NOTE — Progress Notes (Signed)
Mission sling applied to right arm per ortho tech, Rembert.
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
## 2011-10-28 NOTE — Anesthesia Procedure Notes (Signed)
Anesthesia Regional Block:  Supraclavicular block  Pre-Anesthetic Checklist: ,, timeout performed, Correct Patient, Correct Site, Correct Laterality, Correct Procedure, Correct Position, site marked, Risks and benefits discussed, pre-op evaluation,  At surgeon's request and post-op pain management  Laterality: Right  Prep: Maximum Sterile Barrier Precautions used and chloraprep       Needles:  Injection technique: Single-shot  Needle Type: Echogenic Stimulator Needle          Additional Needles:  Procedures: ultrasound guided and nerve stimulator Supraclavicular block  Nerve Stimulator or Paresthesia:  Response: Biceps response,   Additional Responses:   Narrative:  Start time: 10/28/2011 1:00 PM End time: 10/28/2011 1:18 PM Injection made incrementally with aspirations every 5 mL. Anesthesiologist: Sampson Goon, MD  Additional Notes: 2% Lidocaine skin wheel. Intercostobrachial block with 5cc Ropivicaine 0.5%.  Interscalene brachial plexus block

## 2011-10-29 ENCOUNTER — Encounter (HOSPITAL_COMMUNITY): Payer: Self-pay | Admitting: Orthopedic Surgery

## 2011-10-29 MED FILL — Mupirocin Oint 2%: CUTANEOUS | Qty: 22 | Status: AC
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
## 2011-10-29 NOTE — Transfer of Care (Signed)
Immediate Anesthesia Transfer of Care Note  Patient: [name]  Procedure(s) Performed:  OPEN REDUCTION INTERNAL FIXATION (ORIF) ULNAR FRACTURE - ORIF RIGHT DISTAL RADIAL FRACTURE WITH REPAIR AND RECONSTRUCTION  Patient Location: PACU  Anesthesia Type: MAC  Level of Consciousness: awake, alert  and oriented  Airway & Oxygen Therapy: Patient Spontanous Breathing and Patient connected to nasal cannula oxygen  Post-op Assessment: Report given to PACU RN and Post -op Vital signs reviewed and stable  Post vital signs: Reviewed and stable  Complications: No apparent anesthesia complications
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
## 2011-10-29 NOTE — Op Note (Signed)
[name], [name] NO.:  [id number]  MEDICAL RECORD NO.:  [id number]  LOCATION:  5035                         FACILITY:  [facility]  PHYSICIAN:  Dionne Ano. Keana Dueitt, M.D.DATE OF BIRTH:  [birth year]  DATE OF PROCEDURE: DATE OF DISCHARGE:                              OPERATIVE REPORT   PREOPERATIVE DIAGNOSIS:  Comminuted complex greater than 3-part distal radius fracture.  POSTOPERATIVE DIAGNOSIS:  Comminuted complex greater than 3-part distal radius fracture.  PROCEDURE: 1. Open reduction, internal fixation, right distal radius fracture     with DVR plate and screw construct. 2. Stress radiography. 3. Evaluation of distal radioulnar joint under anesthesia and fluoro.  SURGEON: Dionne Ano. Amanda Pea, M.D.  ASSISTANT:  Karie Chimera, P.A.-C.  COMPLICATION:  None.  ANESTHESIA:  Block anesthesia with light IV sedation.  TOURNIQUET TIME:  Less than an hour.  DRAINS:  1.  INDICATIONS FOR THE PROCEDURE:  This patient is a pleasant female, who presents with above-mentioned diagnosis.  I have counseled with her the risks and benefits of surgery including risk of infection, bleeding, anesthesia, damage to normal structures, and failure of surgery, to accomplish its intended goals of relieving symptoms and restoring function.  With this in mind, she desired to proceed.  All questions have been encouraged and answered preoperatively.  OPERATIVE PROCEDURE:  The patient was seen by myself and Anesthesia, taken to the operative suite and underwent smooth induction, IV sedation.  Preoperatively, she had a block placed in the holding area. Block was in excellent working fashion.  Preoperative antibiotics were given, and following this, a sterile prep and drape was accomplished with Betadine scrub paint about the right upper extremity.  I should note that a time-out was called.  Pre and postop checklist complete and sterile field was secured.  Once this done, curvilinear  volar radial approach was made to the wrist, dissection was carried down through the skin.  Following this, FCR tendon sheath was incised palmarly and dorsally.  Fasciotomy was gently performed in the region of the distal forearm volarly.  Following this, carpal canal contents were swept ulnarly, and the pronator was incised in an L-shape fashion, lifted off to the fracture.  She had a large spike in the carpal canal producing impingement about the tendon architecture.  In addition to this, she had a very comminuted region.  I very carefully meticulously performed reduction.  Following this, I placed the DVR plate and screw construct. I was able to achieve excellent radial height inclination and volar tilt.  The plate was applied in standard operative fashion utilizing AO technique with drilling and screw insertion of course.  Following this, I irrigated copiously, closed the pronator with Vicryl, took final copy stress radiographs.  I should note her scapholunate and lunotriquetral joints looked excellent.  There were no complicating features, and I was pleased with all radiographic parameters.  Following this, I then performed evaluation of the distal radioulnar joint.  This looked quite well in terms of her stability.  I was pleased with the findings.  Following this, we irrigated once again with tourniquet deflated.  I made sure that the pronator had a nice secure closure, placed a TLS drain,  closed the subcu with Vicryl, skin with Prolene.  She tolerated the procedure well and there were no complicating features.  All sponge, needle and instrument counts reported as correct.  Sterile dressing and volar plaster splint was applied without difficulty.  She will be monitored in the recovery area and then admitted for overnight observation.  I have discussed the relevant issues, do's and don'ts etc., and all questions have been encouraged and answered.  At the time of first postop  visit, I will need to go ahead and get AP lateral x-rays, sutures out, cast, and at 4 weeks, we will go ahead and begin interval range of motion with therapy at 6-8 weeks.  Consider general light activity at 8 weeks.  We will begin strengthening.     Dionne Ano. Amanda Pea, M.D.     [facility]  D:  10/28/2011  T:  10/29/2011  Job:  [id number]

## 2011-10-31 ENCOUNTER — Encounter (HOSPITAL_COMMUNITY): Payer: Self-pay | Admitting: Anesthesiology

## 2011-11-06 ENCOUNTER — Other Ambulatory Visit (HOSPITAL_COMMUNITY): Payer: Self-pay | Admitting: Otolaryngology

## 2011-11-18 ENCOUNTER — Encounter (HOSPITAL_COMMUNITY): Payer: Self-pay | Admitting: Pharmacy Technician

## 2011-11-20 ENCOUNTER — Encounter (HOSPITAL_COMMUNITY)
Admission: RE | Admit: 2011-11-20 | Discharge: 2011-11-20 | Disposition: A | Discharge status: Home / Self Care | Payer: No Typology Code available for payment source | Source: Ambulatory Visit | Attending: Otolaryngology | Admitting: Otolaryngology

## 2011-11-20 ENCOUNTER — Encounter (HOSPITAL_COMMUNITY): Payer: Self-pay

## 2011-11-20 LAB — BASIC METABOLIC PANEL
BUN: 14 mg/dL (ref 6–23)
CO2: 27 mEq/L (ref 19–32)
Chloride: 101 mEq/L (ref 96–112)
Glucose, Bld: 83 mg/dL (ref 70–99)
Potassium: 3.3 mEq/L — ABNORMAL LOW (ref 3.5–5.1)
Sodium: 139 mEq/L (ref 135–145)

## 2011-11-20 LAB — CBC
HCT: 39.8 % (ref 36.0–46.0)
Hemoglobin: 13.4 g/dL (ref 12.0–15.0)
MCV: 88.8 fL (ref 78.0–100.0)
WBC: 4.7 10*3/uL (ref 4.0–10.5)

## 2011-11-20 NOTE — Pre-Procedure Instructions (Signed)
20 [name]  11/20/2011   Your procedure is scheduled on:  Nov 27, 2011  Report to [facility] Stay [facility] at 0530 AM.  Call this number if you have problems the morning of surgery: [phone]   Remember:   Do not eat food:After Midnight.  May have clear liquids: up to 4 Hours before arrival.  Clear liquids include soda, tea, black coffee, apple or grape juice, broth.  Take these medicines the morning of surgery with A SIP OF WATER: advil, hydrocodone   Do not wear jewelry, make-up or nail polish.  Do not wear lotions, powders, or perfumes. You may wear deodorant.  Do not shave 48 hours prior to surgery.  Do not bring valuables to the hospital.  Contacts, dentures or bridgework may not be worn into surgery.  Leave suitcase in the car. After surgery it may be brought to your room.  For patients admitted to the hospital, checkout time is 11:00 AM the day of discharge.   Patients discharged the day of surgery will not be allowed to drive home.  Name and phone number of your driver: Tad Jones  Special Instructions: CHG Shower Use Special Wash: 1/2 bottle night before surgery and 1/2 bottle morning of surgery.   Please read over the following fact sheets that you were given: Pain Booklet and Surgical Site Infection Prevention

## 2011-11-27 ENCOUNTER — Encounter (HOSPITAL_COMMUNITY): Payer: Self-pay | Admitting: Anesthesiology

## 2011-11-27 ENCOUNTER — Ambulatory Visit (HOSPITAL_COMMUNITY)
Admission: RE | Admit: 2011-11-27 | Discharge: 2011-11-27 | Disposition: A | Discharge status: Home / Self Care | Payer: No Typology Code available for payment source | Source: Ambulatory Visit | Attending: Otolaryngology | Admitting: Otolaryngology

## 2011-11-27 ENCOUNTER — Encounter (HOSPITAL_COMMUNITY)
Admission: RE | Disposition: A | Discharge status: Home / Self Care | Payer: Self-pay | Source: Ambulatory Visit | Attending: Otolaryngology

## 2011-11-27 ENCOUNTER — Ambulatory Visit (HOSPITAL_COMMUNITY): Payer: No Typology Code available for payment source | Admitting: Anesthesiology

## 2011-11-27 DIAGNOSIS — Z01812 Encounter for preprocedural laboratory examination: Secondary | ICD-10-CM | POA: Insufficient documentation

## 2011-11-27 DIAGNOSIS — Z472 Encounter for removal of internal fixation device: Secondary | ICD-10-CM | POA: Insufficient documentation

## 2011-11-27 HISTORY — PX: MANDIBULAR HARDWARE REMOVAL: SHX5205

## 2011-11-27 SURGERY — REMOVAL, HARDWARE, MANDIBLE
Anesthesia: Monitor Anesthesia Care | Site: Mouth | Wound class: Clean Contaminated

## 2011-11-27 MED ORDER — PROPOFOL 10 MG/ML IV EMUL
INTRAVENOUS | Status: DC | PRN
  Administered 2011-11-27: 50 ug/kg/min via INTRAVENOUS

## 2011-11-27 MED ORDER — MORPHINE SULFATE 2 MG/ML IJ SOLN: 0.0500 mg/kg | INTRAMUSCULAR | Status: DC | PRN

## 2011-11-27 MED ORDER — MEPERIDINE HCL 25 MG/ML IJ SOLN: 6.2500 mg | INTRAMUSCULAR | Status: DC | PRN

## 2011-11-27 MED ORDER — MIDAZOLAM HCL 5 MG/5ML IJ SOLN
INTRAMUSCULAR | Status: DC | PRN
  Administered 2011-11-27: 2 mg via INTRAVENOUS
  Administered 2011-11-27: 1 mg via INTRAVENOUS

## 2011-11-27 MED ORDER — PROMETHAZINE HCL 25 MG/ML IJ SOLN: 6.2500 mg | INTRAMUSCULAR | Status: DC | PRN

## 2011-11-27 MED ORDER — FENTANYL CITRATE 0.05 MG/ML IJ SOLN
INTRAMUSCULAR | Status: DC | PRN
  Administered 2011-11-27 (×2): 50 ug via INTRAVENOUS

## 2011-11-27 MED ORDER — ONDANSETRON HCL 4 MG/2ML IJ SOLN
INTRAMUSCULAR | Status: DC | PRN
  Administered 2011-11-27: 4 mg via INTRAVENOUS

## 2011-11-27 MED ORDER — LACTATED RINGERS IV SOLN
INTRAVENOUS | Status: DC | PRN
  Administered 2011-11-27: 07:00:00 via INTRAVENOUS

## 2011-11-27 MED ORDER — HYDROMORPHONE HCL PF 1 MG/ML IJ SOLN: 0.2500 mg | INTRAMUSCULAR | Status: DC | PRN

## 2011-11-27 SURGICAL SUPPLY — 30 items
BLADE SURG 15 STRL LF DISP TIS (BLADE) ×1 IMPLANT
BLADE SURG 15 STRL SS (BLADE) ×2
BLADE SURG ROTATE 9660 (MISCELLANEOUS) IMPLANT
CANISTER SUCTION 2500CC (MISCELLANEOUS) ×2 IMPLANT
CLEANER TIP ELECTROSURG 2X2 (MISCELLANEOUS) ×2 IMPLANT
CLOTH BEACON ORANGE TIMEOUT ST (SAFETY) ×2 IMPLANT
COVER SURGICAL LIGHT HANDLE (MISCELLANEOUS) ×2 IMPLANT
DECANTER SPIKE VIAL GLASS SM (MISCELLANEOUS) ×2 IMPLANT
DRSG NASOPORE 8CM (GAUZE/BANDAGES/DRESSINGS) IMPLANT
ELECT COATED BLADE 2.86 ST (ELECTRODE) ×2 IMPLANT
ELECT REM PT RETURN 9FT ADLT (ELECTROSURGICAL)
ELECTRODE REM PT RTRN 9FT ADLT (ELECTROSURGICAL) IMPLANT
GLOVE SURG SS PI 7.5 STRL IVOR (GLOVE) ×2 IMPLANT
GOWN STRL NON-REIN LRG LVL3 (GOWN DISPOSABLE) ×4 IMPLANT
KIT BASIN OR (CUSTOM PROCEDURE TRAY) ×2 IMPLANT
KIT ROOM TURNOVER OR (KITS) ×2 IMPLANT
NEEDLE 27GAX1X1/2 (NEEDLE) ×2 IMPLANT
NS IRRIG 1000ML POUR BTL (IV SOLUTION) ×2 IMPLANT
PAD ARMBOARD 7.5X6 YLW CONV (MISCELLANEOUS) ×4 IMPLANT
PENCIL FOOT CONTROL (ELECTRODE) IMPLANT
SCISSORS WIRE ANG 4 3/4 DISP (INSTRUMENTS) IMPLANT
SUT ETHILON 4 0 CL P 3 (SUTURE) IMPLANT
SUT MON AB 3-0 SH 27 (SUTURE)
SUT MON AB 3-0 SH27 (SUTURE) IMPLANT
SUT PROLENE 6 0 PC 1 (SUTURE) IMPLANT
SUT VICRYL 4-0 PS2 18IN ABS (SUTURE) IMPLANT
TOWEL OR 17X24 6PK STRL BLUE (TOWEL DISPOSABLE) ×2 IMPLANT
TOWEL OR 17X26 10 PK STRL BLUE (TOWEL DISPOSABLE) ×2 IMPLANT
TRAY ENT MC OR (CUSTOM PROCEDURE TRAY) ×2 IMPLANT
WATER STERILE IRR 1000ML POUR (IV SOLUTION) ×2 IMPLANT
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
## 2011-11-27 NOTE — Preoperative (Signed)
Beta Blockers   Reason not to administer Beta Blockers: not prescribed 

## 2011-11-27 NOTE — Transfer of Care (Signed)
Immediate Anesthesia Transfer of Care Note  Patient: [name]  Procedure(s) Performed:  MANDIBULAR HARDWARE REMOVAL  Patient Location: PACU  Anesthesia Type: MAC  Level of Consciousness: awake, alert  and oriented  Airway & Oxygen Therapy: Patient Spontanous Breathing  Post-op Assessment: Report given to PACU RN, Post -op Vital signs reviewed and stable and Patient moving all extremities  Post vital signs: Reviewed and stable  Complications: No apparent anesthesia complications
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
## 2011-11-27 NOTE — Anesthesia Preprocedure Evaluation (Addendum)
Anesthesia Evaluation  Patient identified by MRN, date of birth, ID band Patient awake    Reviewed: Allergy & Precautions, H&P , NPO status , Patient's Chart, lab work & pertinent test results, reviewed documented beta blocker date and time   Airway      Comment: Unable to assess r/t wires Dental  (+) Teeth Intact   Pulmonary  clear to auscultation        Cardiovascular neg cardio ROS Regular Normal    Neuro/Psych Negative Neurological ROS  Negative Psych ROS   GI/Hepatic negative GI ROS, Neg liver ROS,   Endo/Other  Negative Endocrine ROS  Renal/GU      Musculoskeletal   Abdominal   Peds  Hematology negative hematology ROS (+)   Anesthesia Other Findings   Reproductive/Obstetrics                          Anesthesia Physical Anesthesia Plan  ASA: I  Anesthesia Plan: MAC   Post-op Pain Management:    Induction:   Airway Management Planned: Nasal Cannula  Additional Equipment:   Intra-op Plan:   Post-operative Plan:   Informed Consent: I have reviewed the patients History and Physical, chart, labs and discussed the procedure including the risks, benefits and alternatives for the proposed anesthesia with the patient or authorized representative who has indicated his/her understanding and acceptance.   Dental advisory given  Plan Discussed with: CRNA and Anesthesiologist  Anesthesia Plan Comments:         Anesthesia Quick Evaluation

## 2011-11-27 NOTE — Anesthesia Postprocedure Evaluation (Signed)
  Anesthesia Post-op Note  Patient: [name]  Procedure(s) Performed:  MANDIBULAR HARDWARE REMOVAL  Patient Location: PACU  Anesthesia Type: General  Level of Consciousness: awake  Airway and Oxygen Therapy: Patient Spontanous Breathing  Post-op Pain: mild  Post-op Assessment: Post-op Vital signs reviewed  Post-op Vital Signs: stable  Complications: No apparent anesthesia complications

## 2011-11-28 ENCOUNTER — Encounter (HOSPITAL_COMMUNITY): Payer: Self-pay | Admitting: Otolaryngology
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
## 2013-06-23 ENCOUNTER — Ambulatory Visit (INDEPENDENT_AMBULATORY_CARE_PROVIDER_SITE_OTHER): Payer: 59 | Admitting: Family Medicine

## 2013-06-23 ENCOUNTER — Encounter: Payer: Self-pay | Admitting: Family Medicine

## 2013-06-23 VITALS — BP 100/68 | HR 59 | Temp 98.6°F | Wt 137.6 lb

## 2013-06-23 DIAGNOSIS — J019 Acute sinusitis, unspecified: Secondary | ICD-10-CM

## 2013-06-23 DIAGNOSIS — R05 Cough: Secondary | ICD-10-CM

## 2013-06-23 MED ORDER — AMOXICILLIN-POT CLAVULANATE 875-125 MG PO TABS: 1.0000 | ORAL_TABLET | Freq: Two times a day (BID) | ORAL | Status: DC

## 2013-06-23 MED ORDER — AZELASTINE-FLUTICASONE 137-50 MCG/ACT NA SUSP: 1.0000 | Freq: Two times a day (BID) | NASAL | Status: DC

## 2013-06-23 MED ORDER — GUAIFENESIN-CODEINE 100-10 MG/5ML PO SYRP: ORAL_SOLUTION | ORAL | Status: DC

## 2013-06-23 NOTE — Patient Instructions (Signed)

## 2013-06-23 NOTE — Progress Notes (Signed)
  Subjective:     [name] is a 37 y.o. female who presents for evaluation of sinus pain. Symptoms include: congestion, cough, facial pain, headaches, nasal congestion, sinus pressure and tooth pain. Onset of symptoms was 3 weeks ago. Symptoms have been gradually worsening since that time. Past history is significant for no history of pneumonia or bronchitis. Patient is a non-smoker.  The following portions of the patient's history were reviewed and updated as appropriate: allergies, current medications, past family history, past medical history, past social history, past surgical history and problem list.  Review of Systems Pertinent items are noted in HPI.   Objective:    BP 100/68  Pulse 59  Temp(Src) 98.6 F (37 C) (Oral)  Wt 137 lb 9.6 oz (62.415 kg)  BMI 24.38 kg/m2  SpO2 97% General appearance: alert, cooperative, appears stated age and no distress Ears: + fluid b/l Nose: green discharge, moderate congestion, turbinates red, swollen, sinus tenderness bilateral Throat: lips, mucosa, and tongue normal; teeth and gums normal Neck: mild anterior cervical adenopathy, supple, symmetrical, trachea midline and thyroid not enlarged, symmetric, no tenderness/mass/nodules Lungs: clear to auscultation bilaterally Heart: S1, S2 normal    Assessment:    Acute bacterial sinusitis.    Plan:    Nasal steroids per medication orders. Antihistamines per medication orders. Augmentin per medication orders.

## 2013-08-19 ENCOUNTER — Telehealth: Payer: Self-pay

## 2013-08-19 NOTE — Telephone Encounter (Addendum)
Left message for call back Non identifiable  Medication and allergies: reviewed and updated  90 day supply/mail order: [facility] Local pharmacy: [facility]   Immunizations due:  UTD  A/P:   No changes in FH or PSH PAP--05/2013--Dr Senaida Ores Tdap--10/2011 per patient Flu vaccine--07/2013 per patient  To Discuss with Provider: Needs cholesterol screening for work--is not fasting

## 2013-08-22 ENCOUNTER — Encounter: Payer: Self-pay | Admitting: Family Medicine

## 2013-08-22 ENCOUNTER — Ambulatory Visit (INDEPENDENT_AMBULATORY_CARE_PROVIDER_SITE_OTHER): Payer: 59 | Admitting: Family Medicine

## 2013-08-22 VITALS — BP 120/80 | HR 82 | Temp 98.1°F | Resp 16 | Ht 63.5 in | Wt 140.0 lb

## 2013-08-22 DIAGNOSIS — D239 Other benign neoplasm of skin, unspecified: Secondary | ICD-10-CM

## 2013-08-22 DIAGNOSIS — D229 Melanocytic nevi, unspecified: Secondary | ICD-10-CM | POA: Insufficient documentation

## 2013-08-22 DIAGNOSIS — Z0000 Encounter for general adult medical examination without abnormal findings: Secondary | ICD-10-CM | POA: Insufficient documentation

## 2013-08-22 MED ORDER — ZOLPIDEM TARTRATE 10 MG PO TABS: 10.0000 mg | ORAL_TABLET | Freq: Every evening | ORAL | Status: AC | PRN

## 2013-08-22 NOTE — Assessment & Plan Note (Signed)
Pt's PE WNL w/ exception of atypical mole.  Pt to return for fasting lab.  UTD on GYN.

## 2013-08-22 NOTE — Assessment & Plan Note (Signed)
New.  Refer to Derm for complete evaluation and tx

## 2013-08-22 NOTE — Patient Instructions (Signed)
Schedule a fasting lab appt We'll notify you of your lab results and make any changes if needed We'll call you with your derm appt Keep up the good work!  You look great! Welcome!  We're glad to have you!

## 2013-08-22 NOTE — Progress Notes (Signed)
  Subjective:    Patient ID: [name], female    DOB: [birth year], 37 y.o.   MRN: [id number]  HPI New to establish.  Previous MD- Edmon Crape   GYN- Senaida Ores   Review of Systems Patient reports no vision/ hearing changes, adenopathy,fever, weight change,  persistant/recurrent hoarseness , swallowing issues, chest pain, palpitations, edema, persistant/recurrent cough, hemoptysis, dyspnea (rest/exertional/paroxysmal nocturnal), gastrointestinal bleeding (melena, rectal bleeding), abdominal pain, significant heartburn, bowel changes, GU symptoms (dysuria, hematuria, incontinence), Gyn symptoms (abnormal  bleeding, pain),  syncope, focal weakness, memory loss, numbness & tingling, skin/hair/nail changes, abnormal bruising or bleeding, anxiety, or depression.     Objective:   Physical Exam General Appearance:    Alert, cooperative, no distress, appears stated age  Head:    Normocephalic, without obvious abnormality, atraumatic  Eyes:    PERRL, conjunctiva/corneas clear, EOM's intact, fundi    benign, both eyes  Ears:    Normal TM's and external ear canals, both ears  Nose:   Nares normal, septum midline, mucosa normal, no drainage    or sinus tenderness  Throat:   Lips, mucosa, and tongue normal; teeth and gums normal  Neck:   Supple, symmetrical, trachea midline, no adenopathy;    Thyroid: no enlargement/tenderness/nodules  Back:     Symmetric, no curvature, ROM normal, no CVA tenderness  Lungs:     Clear to auscultation bilaterally, respirations unlabored  Chest Wall:    No tenderness or deformity   Heart:    Regular rate and rhythm, S1 and S2 normal, no murmur, rub   or gallop  Breast Exam:    Deferred to GYN  Abdomen:     Soft, non-tender, bowel sounds active all four quadrants,    no masses, no organomegaly  Genitalia:    Deferred to GYN  Rectal:    Extremities:   Extremities normal, atraumatic, no cyanosis or edema  Pulses:   2+ and symmetric all extremities  Skin:   Skin color,  texture, turgor normal, no rashes or lesions- abnormal mole on R shoulder blade  Lymph nodes:   Cervical, supraclavicular, and axillary nodes normal  Neurologic:   CNII-XII intact, normal strength, sensation and reflexes    throughout          Assessment & Plan:

## 2014-01-31 ENCOUNTER — Encounter: Payer: Self-pay | Admitting: General Practice

## 2014-01-31 NOTE — Progress Notes (Signed)
Letter mailed to pt to notify that labs were not performed at last visit as ordered. Pt advised to contact office to schedule appt.  

## 2014-07-20 ENCOUNTER — Ambulatory Visit (INDEPENDENT_AMBULATORY_CARE_PROVIDER_SITE_OTHER): Payer: 59 | Admitting: Family Medicine

## 2014-07-20 VITALS — BP 101/65 | HR 64 | Temp 98.4°F | Resp 14 | Wt 137.2 lb

## 2014-07-20 DIAGNOSIS — F40243 Fear of flying: Secondary | ICD-10-CM

## 2014-07-20 MED ORDER — ALPRAZOLAM 0.5 MG PO TABS: 0.5000 mg | ORAL_TABLET | Freq: Every evening | ORAL | Status: AC | PRN

## 2014-07-20 NOTE — Patient Instructions (Signed)
Schedule your complete physical in 3-4 months Take the Alprazolam as needed for flying Call with any questions or concerns Hang in there!!!

## 2014-07-20 NOTE — Assessment & Plan Note (Signed)
New.  Pt is clearly anxious when discussing her fear of flying.  Start xanax prn.  Reviewed that she should not combine alprazolam w/ ETOH.  Pt expressed understanding.

## 2014-07-20 NOTE — Progress Notes (Signed)
Pre visit review using our clinic review tool, if applicable. No additional management support is needed unless otherwise documented below in the visit note. 

## 2014-07-20 NOTE — Progress Notes (Signed)
   Subjective:    Patient ID: [name], female    DOB: 19-Feb-1976, 38 y.o.   MRN: [id number]  HPI Fear of flying- pt reports hx of 'panic attacks' when flying.  Pt reports in the past she has to be 'heavily intoxicated'.  Pt has disruptive and intrusive thoughts of crashing when she is on the plane.   Review of Systems For ROS see HPI     Objective:   Physical Exam  Vitals reviewed. Constitutional: She is oriented to person, place, and time. She appears well-developed and well-nourished. No distress.  Neurological: She is alert and oriented to person, place, and time.  Skin: Skin is warm and dry.  Psychiatric:  Anxious when talking about flying          Assessment & Plan:

## 2014-11-17 ENCOUNTER — Telehealth: Payer: Self-pay | Admitting: Family Medicine

## 2014-11-17 NOTE — Telephone Encounter (Signed)
Pt voicemail is full.

## 2014-11-17 NOTE — Telephone Encounter (Signed)
Caller name: [place], [place] Relation to pt: self  Call back number: [phone] Pharmacy:  Reason for call:  Pt committed to a co worker that she would work for her pushing her CPE to May, pt will be moving to [place] and would like an appointment with lab work before she leaves in April. Please advise

## 2014-11-17 NOTE — Telephone Encounter (Signed)
Ok to put her in hospital follow up per tabori.

## 2014-11-21 ENCOUNTER — Encounter: Payer: 59 | Admitting: Family Medicine
